# Patient Record
Sex: Female | Born: 2000 | Hispanic: Refuse to answer | Marital: Single | Smoking: Never smoker
Health system: Southern US, Community
[De-identification: ages and names within clinical notes are randomized; demographics above are authoritative.]

---
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
## 2019-01-31 ENCOUNTER — Other Ambulatory Visit: Payer: Self-pay | Admitting: Family Medicine

## 2019-01-31 DIAGNOSIS — Z8616 Personal history of COVID-19: Secondary | ICD-10-CM

## 2019-01-31 DIAGNOSIS — M25362 Other instability, left knee: Secondary | ICD-10-CM

## 2019-01-31 DIAGNOSIS — S8992XA Unspecified injury of left lower leg, initial encounter: Secondary | ICD-10-CM

## 2019-01-31 DIAGNOSIS — Z8619 Personal history of other infectious and parasitic diseases: Secondary | ICD-10-CM

## 2019-02-01 ENCOUNTER — Other Ambulatory Visit: Payer: Self-pay

## 2019-02-01 ENCOUNTER — Ambulatory Visit
Admission: RE | Admit: 2019-02-01 | Discharge: 2019-02-01 | Disposition: A | Discharge status: Home / Self Care | Payer: 59 | Source: Ambulatory Visit | Attending: Family Medicine | Admitting: Family Medicine

## 2019-02-01 DIAGNOSIS — S8992XA Unspecified injury of left lower leg, initial encounter: Secondary | ICD-10-CM | POA: Insufficient documentation

## 2019-02-05 ENCOUNTER — Other Ambulatory Visit: Payer: Self-pay

## 2019-02-05 ENCOUNTER — Ambulatory Visit
Admission: RE | Admit: 2019-02-05 | Discharge: 2019-02-05 | Disposition: A | Discharge status: Home / Self Care | Payer: 59 | Source: Ambulatory Visit | Attending: Family Medicine | Admitting: Family Medicine

## 2019-02-05 DIAGNOSIS — M25362 Other instability, left knee: Secondary | ICD-10-CM

## 2019-02-06 ENCOUNTER — Other Ambulatory Visit: Payer: Self-pay | Admitting: Family Medicine

## 2019-02-06 DIAGNOSIS — Z1159 Encounter for screening for other viral diseases: Secondary | ICD-10-CM

## 2019-02-07 LAB — NOVEL CORONAVIRUS, NAA: SARS-CoV-2, NAA: NOT DETECTED

## 2019-02-25 ENCOUNTER — Other Ambulatory Visit: Payer: 59

## 2019-02-27 ENCOUNTER — Other Ambulatory Visit: Payer: 59

## 2019-02-28 ENCOUNTER — Other Ambulatory Visit: Payer: Self-pay | Admitting: *Deleted

## 2019-02-28 DIAGNOSIS — Z20822 Contact with and (suspected) exposure to covid-19: Secondary | ICD-10-CM

## 2019-03-01 LAB — NOVEL CORONAVIRUS, NAA: SARS-CoV-2, NAA: DETECTED — AB

## 2019-03-19 ENCOUNTER — Other Ambulatory Visit: Payer: Self-pay

## 2019-03-19 ENCOUNTER — Other Ambulatory Visit: Payer: Self-pay | Admitting: Family Medicine

## 2019-03-19 ENCOUNTER — Other Ambulatory Visit: Payer: 59 | Admitting: Family Medicine

## 2019-03-19 DIAGNOSIS — Z0000 Encounter for general adult medical examination without abnormal findings: Secondary | ICD-10-CM

## 2019-03-19 DIAGNOSIS — Z8619 Personal history of other infectious and parasitic diseases: Secondary | ICD-10-CM

## 2019-03-19 DIAGNOSIS — Z8616 Personal history of COVID-19: Secondary | ICD-10-CM

## 2019-03-19 DIAGNOSIS — U071 COVID-19: Secondary | ICD-10-CM

## 2019-03-19 DIAGNOSIS — I400 Infective myocarditis: Secondary | ICD-10-CM

## 2019-03-20 LAB — TROPONIN I: Troponin I: <0.01 ng/mL (ref 0.00–0.04)

## 2019-03-25 ENCOUNTER — Other Ambulatory Visit: Payer: 59

## 2019-03-27 ENCOUNTER — Other Ambulatory Visit: Payer: Self-pay

## 2019-03-27 ENCOUNTER — Ambulatory Visit (INDEPENDENT_AMBULATORY_CARE_PROVIDER_SITE_OTHER): Payer: 59

## 2019-03-27 DIAGNOSIS — I400 Infective myocarditis: Secondary | ICD-10-CM

## 2019-03-27 DIAGNOSIS — U071 COVID-19: Secondary | ICD-10-CM

## 2019-03-27 DIAGNOSIS — Z8616 Personal history of COVID-19: Secondary | ICD-10-CM

## 2019-03-27 DIAGNOSIS — Z8619 Personal history of other infectious and parasitic diseases: Secondary | ICD-10-CM

## 2020-03-15 ENCOUNTER — Emergency Department
Admission: EM | Admit: 2020-03-15 | Discharge: 2020-03-16 | Disposition: A | Discharge status: Home / Self Care | Payer: 59 | Attending: Emergency Medicine | Admitting: Emergency Medicine

## 2020-03-15 ENCOUNTER — Emergency Department: Payer: 59

## 2020-03-15 ENCOUNTER — Other Ambulatory Visit: Payer: Self-pay

## 2020-03-15 DIAGNOSIS — R1110 Vomiting, unspecified: Secondary | ICD-10-CM | POA: Diagnosis not present

## 2020-03-15 DIAGNOSIS — R1011 Right upper quadrant pain: Secondary | ICD-10-CM | POA: Insufficient documentation

## 2020-03-15 DIAGNOSIS — B2790 Infectious mononucleosis, unspecified without complication: Secondary | ICD-10-CM | POA: Diagnosis not present

## 2020-03-15 DIAGNOSIS — R6884 Jaw pain: Secondary | ICD-10-CM | POA: Diagnosis present

## 2020-03-15 LAB — CBC WITH DIFFERENTIAL/PLATELET
Abs Immature Granulocytes: 0.14 10*3/uL — ABNORMAL HIGH (ref 0.00–0.07)
Basophils Absolute: 0.3 10*3/uL — ABNORMAL HIGH (ref 0.0–0.1)
Basophils Relative: 2 %
Eosinophils Absolute: 0 10*3/uL (ref 0.0–0.5)
Eosinophils Relative: 0 %
HCT: 39.9 % (ref 36.0–46.0)
Hemoglobin: 14.5 g/dL (ref 12.0–15.0)
Immature Granulocytes: 1 %
Lymphocytes Relative: 73 %
Lymphs Abs: 12.2 10*3/uL — ABNORMAL HIGH (ref 0.7–4.0)
MCH: 31.4 pg (ref 26.0–34.0)
MCHC: 36.3 g/dL — ABNORMAL HIGH (ref 30.0–36.0)
MCV: 86.4 fL (ref 80.0–100.0)
Monocytes Absolute: 2.2 10*3/uL — ABNORMAL HIGH (ref 0.1–1.0)
Monocytes Relative: 13 %
Neutro Abs: 1.9 10*3/uL (ref 1.7–7.7)
Neutrophils Relative %: 11 %
Platelets: 174 10*3/uL (ref 150–400)
RBC: 4.62 MIL/uL (ref 3.87–5.11)
RDW: 13.6 % (ref 11.5–15.5)
WBC Morphology: ABNORMAL
WBC: 16.7 10*3/uL — ABNORMAL HIGH (ref 4.0–10.5)
nRBC: 0 % (ref 0.0–0.2)

## 2020-03-15 LAB — URINALYSIS, COMPLETE (UACMP) WITH MICROSCOPIC
Bacteria, UA: NONE SEEN
Glucose, UA: NEGATIVE mg/dL
Ketones, ur: 80 mg/dL — AB
Leukocytes,Ua: NEGATIVE
Nitrite: NEGATIVE
Protein, ur: 100 mg/dL — AB
Specific Gravity, Urine: 1.034 — ABNORMAL HIGH (ref 1.005–1.030)
pH: 5 (ref 5.0–8.0)

## 2020-03-15 LAB — COMPREHENSIVE METABOLIC PANEL
ALT: 319 U/L — ABNORMAL HIGH (ref 0–44)
AST: 345 U/L — ABNORMAL HIGH (ref 15–41)
Albumin: 3.7 g/dL (ref 3.5–5.0)
Alkaline Phosphatase: 186 U/L — ABNORMAL HIGH (ref 38–126)
Anion gap: 15 (ref 5–15)
BUN: 12 mg/dL (ref 6–20)
CO2: 18 mmol/L — ABNORMAL LOW (ref 22–32)
Calcium: 8.9 mg/dL (ref 8.9–10.3)
Chloride: 102 mmol/L (ref 98–111)
Creatinine, Ser: 0.89 mg/dL (ref 0.44–1.00)
GFR calc Af Amer: >60 mL/min (ref >60)
GFR calc non Af Amer: >60 mL/min (ref >60)
Glucose, Bld: 73 mg/dL (ref 70–99)
Potassium: 4 mmol/L (ref 3.5–5.1)
Sodium: 135 mmol/L (ref 135–145)
Total Bilirubin: 1.5 mg/dL — ABNORMAL HIGH (ref 0.3–1.2)
Total Protein: 8.4 g/dL — ABNORMAL HIGH (ref 6.5–8.1)

## 2020-03-15 LAB — CBC
HCT: 40 % (ref 36.0–46.0)
Hemoglobin: 14.1 g/dL (ref 12.0–15.0)
MCH: 31.1 pg (ref 26.0–34.0)
MCHC: 35.3 g/dL (ref 30.0–36.0)
MCV: 88.1 fL (ref 80.0–100.0)
Platelets: 220 10*3/uL (ref 150–400)
RBC: 4.54 MIL/uL (ref 3.87–5.11)
RDW: 13.3 % (ref 11.5–15.5)
WBC: 15.8 10*3/uL — ABNORMAL HIGH (ref 4.0–10.5)
nRBC: 0 % (ref 0.0–0.2)

## 2020-03-15 LAB — ACETAMINOPHEN LEVEL: Acetaminophen (Tylenol), Serum: <10 ug/mL — ABNORMAL LOW (ref 10–30)

## 2020-03-15 LAB — LIPASE, BLOOD: Lipase: 25 U/L (ref 11–51)

## 2020-03-15 LAB — MONONUCLEOSIS SCREEN: Mono Screen: POSITIVE — AB

## 2020-03-15 NOTE — Discharge Instructions (Addendum)
You have mononucleosis.  This can make your lymph glands swell up like you have.  Please avoid contact sports.  Please follow-up with your primary care doctor in the next week or 2 for recheck and return here right away or see your primary care doctor if you have any increase in swelling or more difficulty swallowing.  You can take oral liquid Motrin or Tylenol if swallowing pills is painful.  Make sure you are drinking and eating although you may want to eat soft food.

## 2020-03-15 NOTE — ED Triage Notes (Signed)
Pt comes pov with mouth pain on the right side for a few days. Not able to eat bc of the pain. Last night woke up and felt like she was choking and vomited bright green bile. Pt states swollen lymph nodes around neck and head as well.

## 2020-03-15 NOTE — ED Provider Notes (Addendum)
[facility] Emergency Department Provider Note   ____________________________________________   First MD Initiated Contact with Patient 03/15/20 2054     (approximate)  I have reviewed the triage vital signs and the nursing notes.   HISTORY  Chief Complaint Dental Pain and Emesis    HPI Cheryl Hayes is a 19 y.o. female who comes in complaining of pain on the right side of her mouth mostly behind the jaw for a few days she woke up last night feeling like she was choking and vomited bright green bile.  She has swollen lymph nodes anteriorly and posteriorly in her neck.  She is not running a fever.  She has some slight right upper quadrant pain as well.      History reviewed. No pertinent past medical history.  There are no problems to display for this patient.   History reviewed. No pertinent surgical history.  Prior to Admission medications   Medication Sig Start Date End Date Taking? Authorizing Provider  ondansetron (ZOFRAN ODT) 4 MG disintegrating tablet Take 1 tablet (4 mg total) by mouth every 8 (eight) hours as needed for nausea or vomiting. 06/19/18   Jolene Provost, MD    Allergies Patient has no known allergies.  Family History  Problem Relation Age of Onset  . Hypertension Maternal Grandmother     Social History Social History   Tobacco Use  . Smoking status: Never Smoker  . Smokeless tobacco: Never Used  Substance Use Topics  . Alcohol use: Yes  . Drug use: Not on file    Review of Systems  Constitutional: No fever/chills Eyes: No visual changes. ENT:  sore throat. Cardiovascular: Denies chest pain. Respiratory: Denies shortness of breath. Gastrointestinal: Slight abdominal pain.  No nausea, no vomiting.  No diarrhea.  No constipation. Genitourinary: Negative for dysuria. Musculoskeletal: Negative for back pain. Skin: Negative for rash. Neurological: Negative for headaches, focal weakness    ____________________________________________   PHYSICAL EXAM:  VITAL SIGNS: ED Triage Vitals [03/15/20 1418]  Enc Vitals Group     BP (!) 135/99     Pulse Rate (!) 113     Resp 18     Temp 98.5 F (36.9 C)     Temp Source Oral     SpO2 100 %     Weight 150 lb (68 kg)     Height 5\' 1"  (1.549 m)     Head Circumference      Peak Flow      Pain Score 8     Pain Loc      Pain Edu?      Excl. in GC?     Constitutional: Alert and oriented. Well appearing and in no acute distress. Eyes: Conjunctivae are normal. PER Head: Atraumatic. Nose: No congestion/rhinnorhea. Mouth/Throat: Mucous membranes are moist.  Oropharynx non-erythematous.  Patient's teeth do not hurt to percussion. Neck: No stridor.   Hematological/Lymphatic/Immunilogical: Bilateral posterior shotty cervical lymphadenopathy.  Anteriorly there is a confluent group of lymph nodes behind the angle of the right jaw and in the right side of the neck.  There are some shotty nodes on the left side of the neck as well.  Anteriorly Cardiovascular: Normal rate, regular rhythm. Grossly normal heart sounds.  Good peripheral circulation. Respiratory: Normal respiratory effort.  No retractions. Lungs CTAB. Gastrointestinal: Soft mild right upper quadrant tenderness to palpation only no organomegaly no distention. No abdominal bruits.  \Musculoskeletal: No lower extremity tenderness nor edema.   Neurologic:  Normal speech and  language. No gross focal neurologic deficits are appreciated. No gait instability. Skin:  Skin is warm, dry and intact. No rash noted.   ____________________________________________   LABS (all labs ordered are listed, but only abnormal results are displayed)  Labs Reviewed  COMPREHENSIVE METABOLIC PANEL - Abnormal; Notable for the following components:      Result Value   CO2 18 (*)    Total Protein 8.4 (*)    AST 345 (*)    ALT 319 (*)    Alkaline Phosphatase 186 (*)    Total Bilirubin 1.5 (*)     All other components within normal limits  CBC - Abnormal; Notable for the following components:   WBC 15.8 (*)    All other components within normal limits  URINALYSIS, COMPLETE (UACMP) WITH MICROSCOPIC - Abnormal; Notable for the following components:   Color, Urine AMBER (*)    APPearance HAZY (*)    Specific Gravity, Urine 1.034 (*)    Hgb urine dipstick MODERATE (*)    Bilirubin Urine SMALL (*)    Ketones, ur 80 (*)    Protein, ur 100 (*)    All other components within normal limits  ACETAMINOPHEN LEVEL - Abnormal; Notable for the following components:   Acetaminophen (Tylenol), Serum <10 (*)    All other components within normal limits  MONONUCLEOSIS SCREEN - Abnormal; Notable for the following components:   Mono Screen POSITIVE (*)    All other components within normal limits  CBC WITH DIFFERENTIAL/PLATELET - Abnormal; Notable for the following components:   WBC 16.7 (*)    MCHC 36.3 (*)    Lymphs Abs 12.2 (*)    Monocytes Absolute 2.2 (*)    Basophils Absolute 0.3 (*)    Abs Immature Granulocytes 0.14 (*)    All other components within normal limits  LIPASE, BLOOD  DIFFERENTIAL  CBC  PATHOLOGIST SMEAR REVIEW  POC URINE PREG, ED   ____________________________________________  EKG   ____________________________________________  RADIOLOGY Jill Poling, personally viewed and evaluated these images (plain radiographs) as part of my medical decision making, as well as reviewing the written report by the radiologist.  ED MD interpretation: Ultrasound read by radiology shows normal gallbladder  Official radiology report(s): US Abdomen Limited  Result Date: 03/15/2020 CLINICAL DATA:  Abdominal pain and elevated LFTs EXAM: ULTRASOUND ABDOMEN LIMITED RIGHT UPPER QUADRANT COMPARISON:  None. FINDINGS: Gallbladder: No gallstones or wall thickening visualized. No sonographic Murphy sign noted by sonographer. Common bile duct: Diameter: 2.7 mm Liver: No focal lesion  identified. Mildly prominent intrahepatic biliary ducts. Portal vein is patent on color Doppler imaging with normal direction of blood flow towards the liver. Other: None. IMPRESSION: Normal appearing gallbladder. Nonspecific mild intrahepatic biliary ductal prominence. Electronically Signed   By: Jonna Clark M.D.   On: 03/15/2020 22:01    ____________________________________________   PROCEDURES  Procedure(s) performed (including Critical Care):  Procedures   ____________________________________________   INITIAL IMPRESSION / ASSESSMENT AND PLAN / ED COURSE  Patient's Monospot is negative review of up-to-date shows that there can be mono which is mostly swollen glands without a great deal of sore throat.  This appears to be what the patient has.  Patient does not have any organomegaly.  I will give her contact sports precautions anyway.  She is not having any trouble swallowing her secretions.  Up-to-date recommends no steroids or antiviral treatment at this point just Tylenol for pain.  Patient is only had 1 dose of Tylenol 650 mg.  Patient has no firmness or swelling in the submandibular area.          ____________________________________________   FINAL CLINICAL IMPRESSION(S) / ED DIAGNOSES  Final diagnoses:  Infectious mononucleosis without complication, infectious mononucleosis due to unspecified organism     ED Discharge Orders    None      *Please note:  Cheryl Hayes was evaluated in Emergency Department on 03/16/2020 for the symptoms described in the history of present illness. She was evaluated in the context of the global COVID-19 pandemic, which necessitated consideration that the patient might be at risk for infection with the SARS-CoV-2 virus that causes COVID-19. Institutional protocols and algorithms that pertain to the evaluation of patients at risk for COVID-19 are in a state of rapid change based on information released by regulatory bodies including  the CDC and federal and state organizations. These policies and algorithms were followed during the patient's care in the ED.  Some ED evaluations and interventions may be delayed as a result of limited staffing during and the pandemic.*   Note:  This document was prepared using Dragon voice recognition software and may include unintentional dictation errors.    Arnaldo Natal, MD 03/16/20 0001    Arnaldo Natal, MD 03/16/20 [phone]

## 2020-03-16 ENCOUNTER — Other Ambulatory Visit: Payer: Self-pay

## 2020-03-16 ENCOUNTER — Encounter: Payer: Self-pay | Admitting: Emergency Medicine

## 2020-03-16 ENCOUNTER — Emergency Department
Admission: EM | Admit: 2020-03-16 | Discharge: 2020-03-16 | Disposition: A | Discharge status: Home / Self Care | Payer: 59 | Attending: Emergency Medicine | Admitting: Emergency Medicine

## 2020-03-16 DIAGNOSIS — R1110 Vomiting, unspecified: Secondary | ICD-10-CM | POA: Diagnosis not present

## 2020-03-16 DIAGNOSIS — Z5321 Procedure and treatment not carried out due to patient leaving prior to being seen by health care provider: Secondary | ICD-10-CM | POA: Insufficient documentation

## 2020-03-16 LAB — COMPREHENSIVE METABOLIC PANEL
ALT: 325 U/L — ABNORMAL HIGH (ref 0–44)
AST: 326 U/L — ABNORMAL HIGH (ref 15–41)
Albumin: 3.7 g/dL (ref 3.5–5.0)
Alkaline Phosphatase: 200 U/L — ABNORMAL HIGH (ref 38–126)
Anion gap: 13 (ref 5–15)
BUN: 12 mg/dL (ref 6–20)
CO2: 20 mmol/L — ABNORMAL LOW (ref 22–32)
Calcium: 8.8 mg/dL — ABNORMAL LOW (ref 8.9–10.3)
Chloride: 101 mmol/L (ref 98–111)
Creatinine, Ser: 0.95 mg/dL (ref 0.44–1.00)
GFR calc Af Amer: >60 mL/min (ref >60)
GFR calc non Af Amer: >60 mL/min (ref >60)
Glucose, Bld: 111 mg/dL — ABNORMAL HIGH (ref 70–99)
Potassium: 3.6 mmol/L (ref 3.5–5.1)
Sodium: 134 mmol/L — ABNORMAL LOW (ref 135–145)
Total Bilirubin: 1.4 mg/dL — ABNORMAL HIGH (ref 0.3–1.2)
Total Protein: 8.4 g/dL — ABNORMAL HIGH (ref 6.5–8.1)

## 2020-03-16 LAB — CBC
HCT: 39.2 % (ref 36.0–46.0)
Hemoglobin: 13.4 g/dL (ref 12.0–15.0)
MCH: 30.2 pg (ref 26.0–34.0)
MCHC: 34.2 g/dL (ref 30.0–36.0)
MCV: 88.3 fL (ref 80.0–100.0)
Platelets: 202 10*3/uL (ref 150–400)
RBC: 4.44 MIL/uL (ref 3.87–5.11)
RDW: 13.3 % (ref 11.5–15.5)
WBC: 12.5 10*3/uL — ABNORMAL HIGH (ref 4.0–10.5)
nRBC: 0 % (ref 0.0–0.2)

## 2020-03-16 LAB — PATHOLOGIST SMEAR REVIEW

## 2020-03-16 LAB — DIFFERENTIAL: Monocytes Relative: POSITIVE %

## 2020-03-16 LAB — LIPASE, BLOOD: Lipase: 27 U/L (ref 11–51)

## 2020-03-16 MED ORDER — ONDANSETRON 4 MG PO TBDP: 4.0000 mg | ORAL_TABLET | Freq: Once | ORAL | Status: AC | PRN

## 2020-03-16 MED ORDER — ONDANSETRON 4 MG PO TBDP
ORAL_TABLET | ORAL | Status: AC
  Administered 2020-03-16: 4 mg via ORAL
  Filled 2020-03-16: qty 1

## 2020-03-16 NOTE — ED Triage Notes (Signed)
Pt arrives POV to triage with c/o vomiting clear bile and left sided pain after emesis. Pt was just seen here and diagnosed with Mono.

## 2020-11-13 IMAGING — US US ABDOMEN LIMITED
1 series · 14 of 25 positions shown · non-contrast
Comparison: None.

CLINICAL DATA: Abdominal pain and elevated LFTs

EXAM:
ULTRASOUND ABDOMEN LIMITED RIGHT UPPER QUADRANT

[Series 1: us abdomen limited · 14 of 27 slices shown]
[im 1/27]
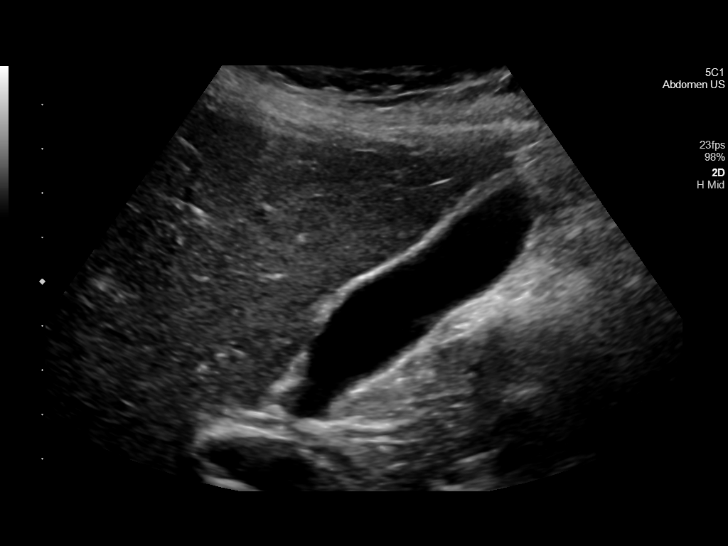
[im 3/27]
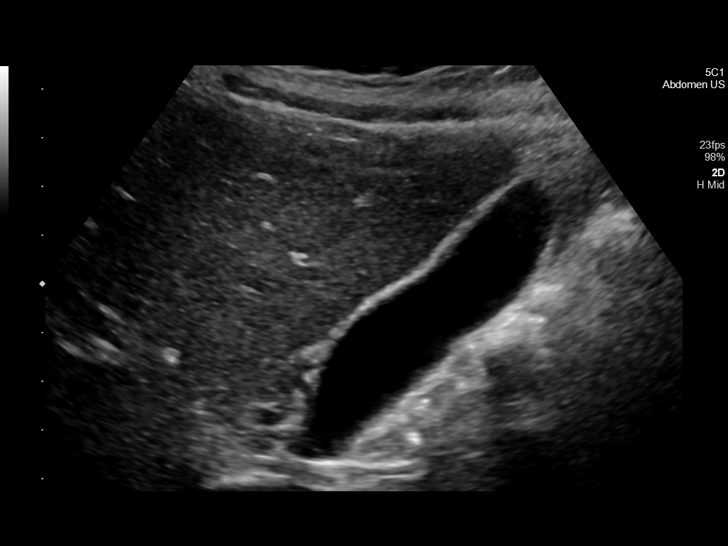
[im 5/27]
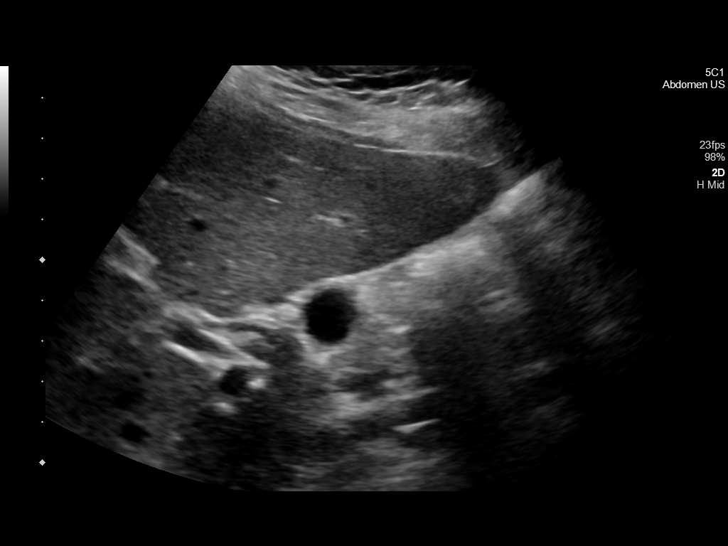
[im 7/27]
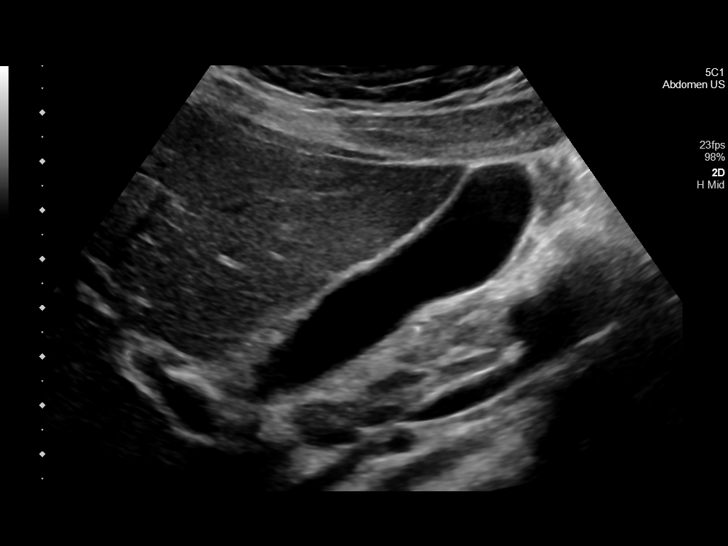
[im 9/27]
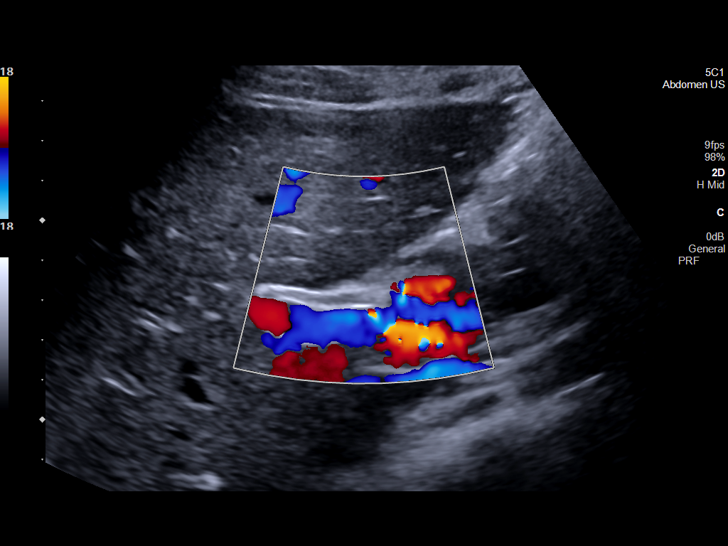
[im 10/27]
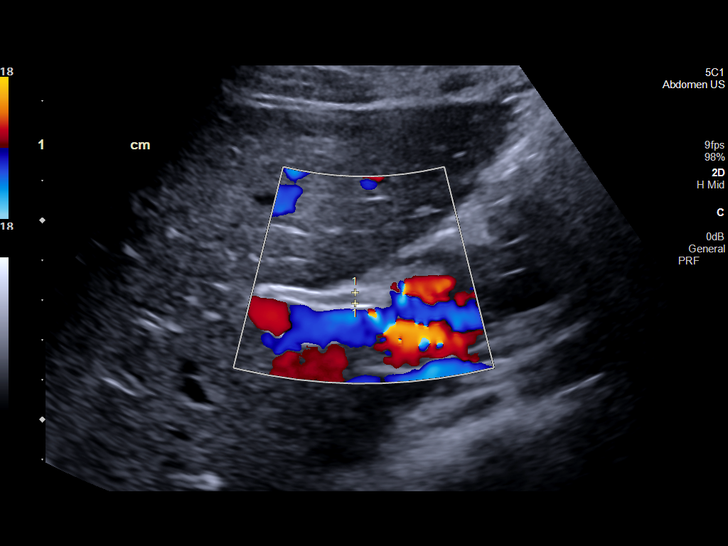
[im 12/27]
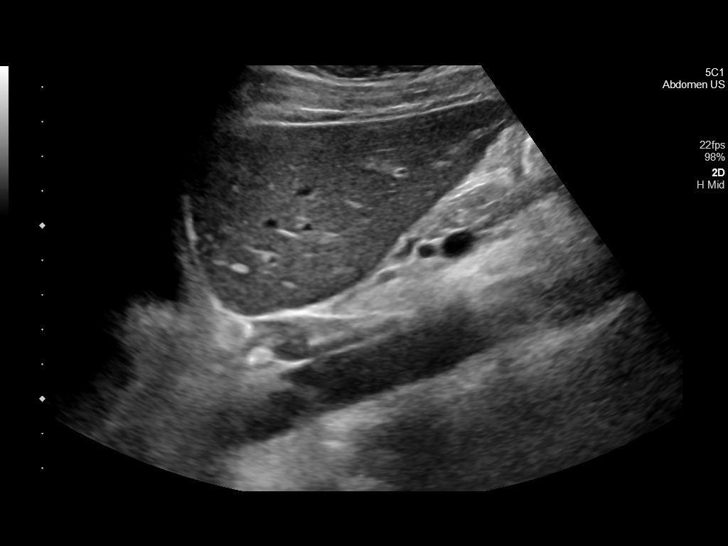
[im 15/27]
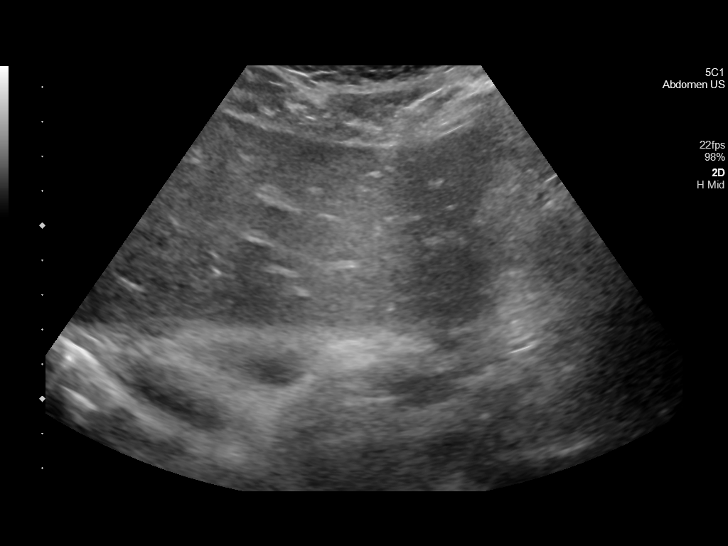
[im 17/27]
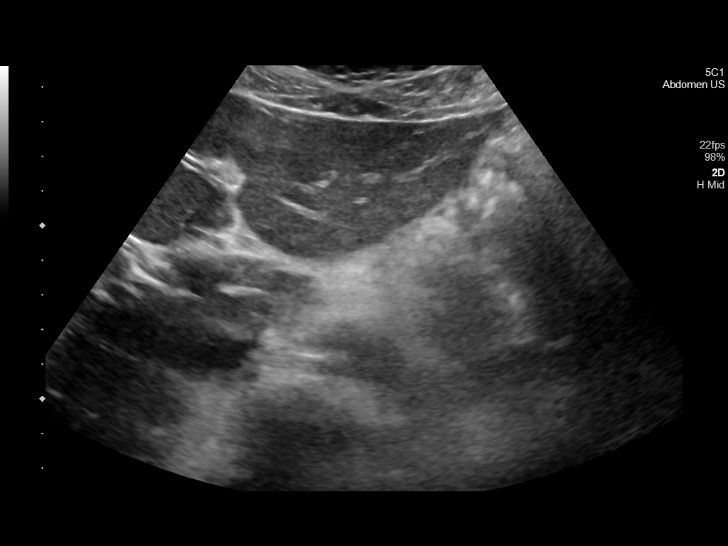
[im 18/27]
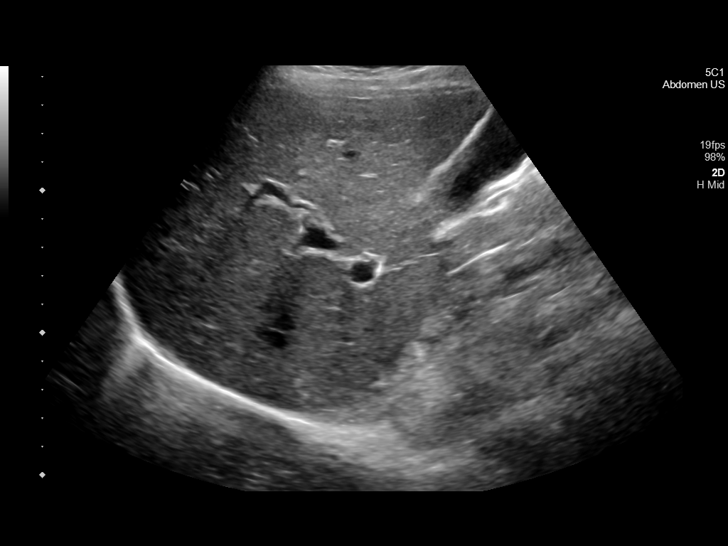
[im 20/27]
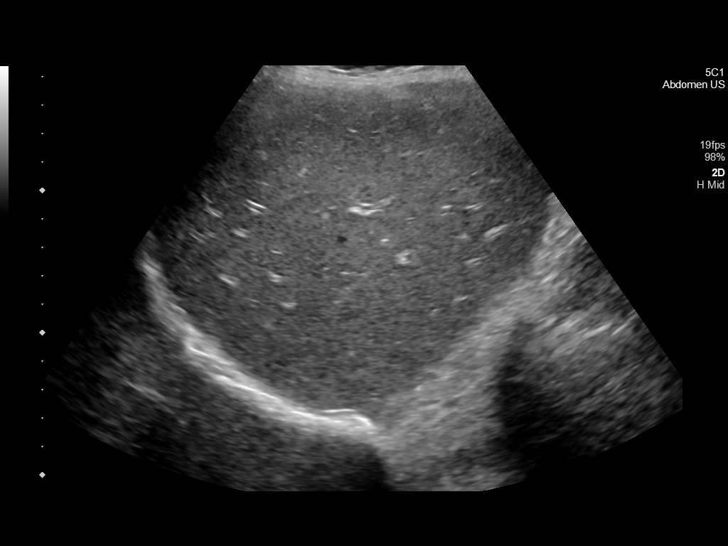
[im 22/27]
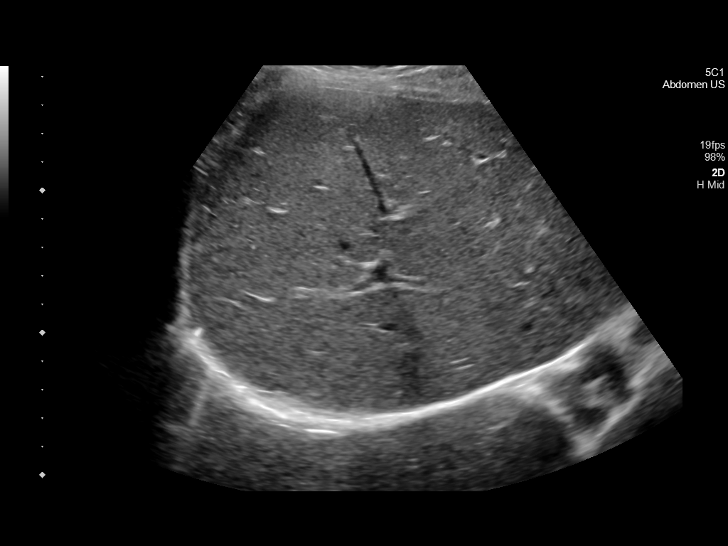
[im 24/27]
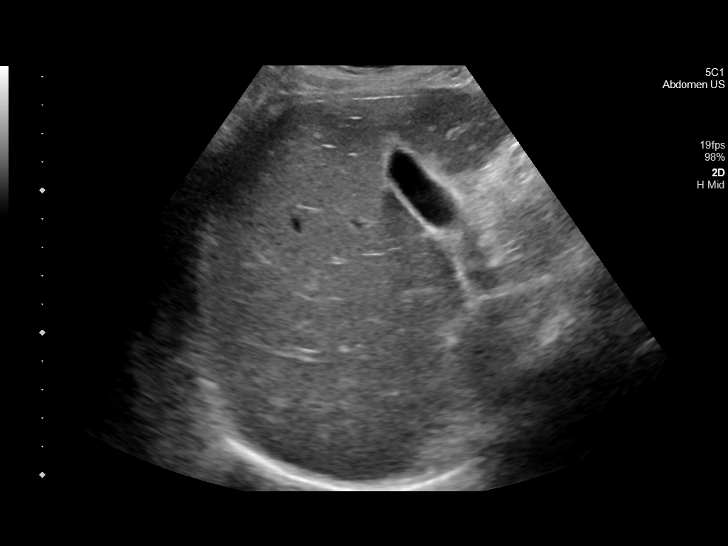
[im 27/27]
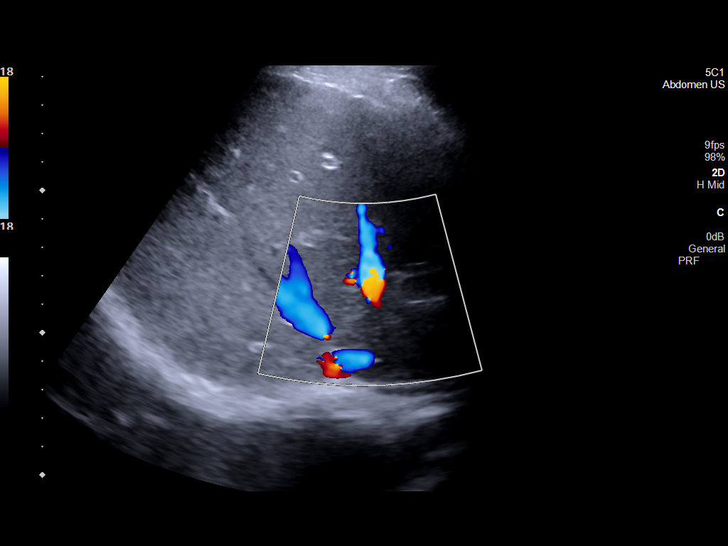

[14 of 25 positions shown; findings below may reference images not displayed]

FINDINGS: Gallbladder:

No gallstones or wall thickening visualized. No sonographic Murphy
sign noted by sonographer.

Common bile duct:

Diameter: 2.7 mm

Liver:

No focal lesion identified. Mildly prominent intrahepatic biliary
ducts. Portal vein is patent on color Doppler imaging with normal
direction of blood flow towards the liver.

Other: None.
IMPRESSION: Normal appearing gallbladder.

Nonspecific mild intrahepatic biliary ductal prominence.
